# Patient Record
Sex: Female | Born: 2005 | Race: White | Hispanic: No | Marital: Single | Smoking: Never smoker
Health system: Southern US, Community
[De-identification: ages and names within clinical notes are randomized; demographics above are authoritative.]

## PROBLEM LIST (undated history)

## (undated) DIAGNOSIS — J302 Other seasonal allergic rhinitis: Secondary | ICD-10-CM

## (undated) HISTORY — PX: NO PAST SURGERIES: SHX2092

---
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
## 2024-01-06 ENCOUNTER — Other Ambulatory Visit: Payer: Self-pay

## 2024-01-06 ENCOUNTER — Ambulatory Visit: Attending: Physician Assistant | Admitting: Physical Therapy

## 2024-01-06 ENCOUNTER — Encounter: Payer: Self-pay | Admitting: Physical Therapy

## 2024-01-06 DIAGNOSIS — M6281 Muscle weakness (generalized): Secondary | ICD-10-CM | POA: Insufficient documentation

## 2024-01-06 DIAGNOSIS — M25512 Pain in left shoulder: Secondary | ICD-10-CM | POA: Diagnosis present
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
## 2024-01-16 ENCOUNTER — Ambulatory Visit: Admitting: Physical Therapy

## 2024-01-24 ENCOUNTER — Telehealth: Payer: Self-pay | Admitting: Physical Therapy

## 2024-01-24 ENCOUNTER — Ambulatory Visit: Admitting: Physical Therapy

## 2024-01-24 DIAGNOSIS — M25512 Pain in left shoulder: Secondary | ICD-10-CM | POA: Insufficient documentation

## 2024-01-24 DIAGNOSIS — M6281 Muscle weakness (generalized): Secondary | ICD-10-CM | POA: Insufficient documentation
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
## 2024-01-24 NOTE — Telephone Encounter (Signed)
 PT left a message on Pt's mother's phone.  Pt was a no show for PT on 01/24/24 at 8:45am. Requested a call back to confirm schedule.  Saint Hank, PT 01/24/2024 9:05 AM

## 2024-01-31 ENCOUNTER — Ambulatory Visit: Admitting: Physical Therapy

## 2024-02-10 ENCOUNTER — Ambulatory Visit

## 2024-02-10 DIAGNOSIS — M25512 Pain in left shoulder: Secondary | ICD-10-CM

## 2024-02-10 DIAGNOSIS — M6281 Muscle weakness (generalized): Secondary | ICD-10-CM | POA: Diagnosis present
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
## 2024-02-17 ENCOUNTER — Ambulatory Visit: Attending: Physician Assistant | Admitting: Physical Therapy

## 2024-02-17 ENCOUNTER — Encounter: Payer: Self-pay | Admitting: Physical Therapy

## 2024-02-17 DIAGNOSIS — M6281 Muscle weakness (generalized): Secondary | ICD-10-CM | POA: Diagnosis present

## 2024-02-17 DIAGNOSIS — M25512 Pain in left shoulder: Secondary | ICD-10-CM | POA: Insufficient documentation
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
## 2024-02-24 ENCOUNTER — Ambulatory Visit: Admitting: Physical Therapy
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
## 2024-02-28 ENCOUNTER — Telehealth: Payer: Self-pay | Admitting: Rehabilitative and Restorative Service Providers"

## 2024-02-28 ENCOUNTER — Ambulatory Visit: Admitting: Rehabilitative and Restorative Service Providers"

## 2024-02-28 NOTE — Telephone Encounter (Signed)
 Called patient secondary to missed visit on 02/28/2024 at 10:15 AM.  Patient's mother apologized stating that she meant to call and cancel the appointment, but the patient only wanted to work with Orvil Fester, PT due to patient being shy and only wanting one PT.  Advised patient's mother of next scheduled visit and she confirmed the appointment.  Also, reminded patient's mother that this clinic does have a voicemail box where she can leave voice messages after hours if she needs to cancel/reschedule any appointments, she verbalized appreciation of the information.

## 2024-03-06 ENCOUNTER — Ambulatory Visit: Admitting: Physical Therapy

## 2024-03-06 ENCOUNTER — Encounter: Payer: Self-pay | Admitting: Physical Therapy

## 2024-03-06 DIAGNOSIS — M25512 Pain in left shoulder: Secondary | ICD-10-CM

## 2024-03-06 DIAGNOSIS — M6281 Muscle weakness (generalized): Secondary | ICD-10-CM
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
